# Patient Record
Sex: Female | Born: 1995 | Race: White | Hispanic: No | Marital: Single | Smoking: Current every day smoker
Health system: Southern US, Community
[De-identification: ages and names within clinical notes are randomized; demographics above are authoritative.]

## PROBLEM LIST (undated history)

## (undated) DIAGNOSIS — F329 Major depressive disorder, single episode, unspecified: Secondary | ICD-10-CM

## (undated) DIAGNOSIS — R55 Syncope and collapse: Secondary | ICD-10-CM

## (undated) DIAGNOSIS — J45909 Unspecified asthma, uncomplicated: Secondary | ICD-10-CM

## (undated) DIAGNOSIS — F32A Depression, unspecified: Secondary | ICD-10-CM

## (undated) DIAGNOSIS — F419 Anxiety disorder, unspecified: Secondary | ICD-10-CM

## (undated) HISTORY — DX: Unspecified asthma, uncomplicated: J45.909

## (undated) HISTORY — DX: Depression, unspecified: F32.A

## (undated) HISTORY — DX: Anxiety disorder, unspecified: F41.9

## (undated) HISTORY — DX: Major depressive disorder, single episode, unspecified: F32.9

## (undated) HISTORY — DX: Syncope and collapse: R55

---
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
## 2017-04-16 IMAGING — CT CT HEAD W/O CM
1 series · 16 of 30 positions shown, 20 images · non-contrast
Comparison: None.

CLINICAL DATA: Syncope. Patient has passed a 12 times in the past
year.

EXAM:
CT HEAD WITHOUT CONTRAST
TECHNIQUE: Contiguous axial images were obtained from the base of the skull
through the vertex without intravenous contrast.

[Series 2: head wo · axial · 0.40mm/px · z∈[-52,+92]mm · 16 of 36 slices shown, 20 images]
[im 2/36  brain]
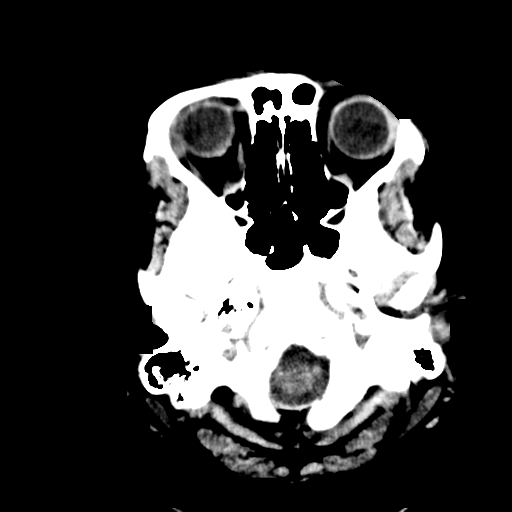
[im 2/36  bone]
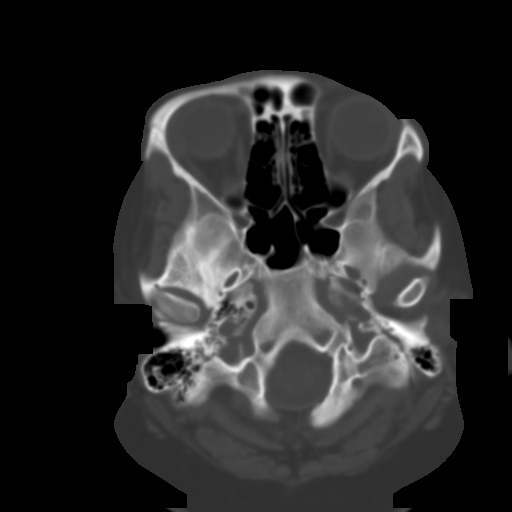
[im 4/36  brain]
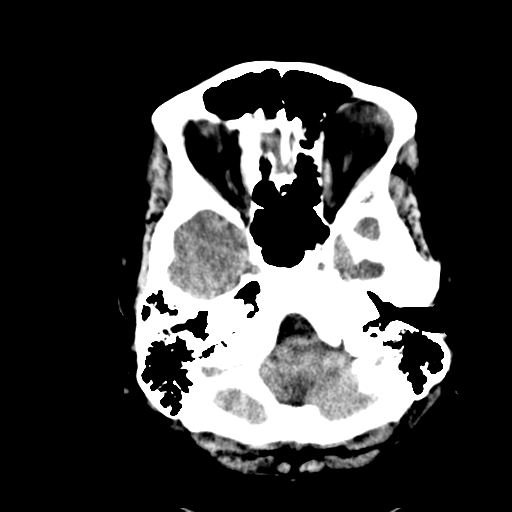
[im 7/36  brain]
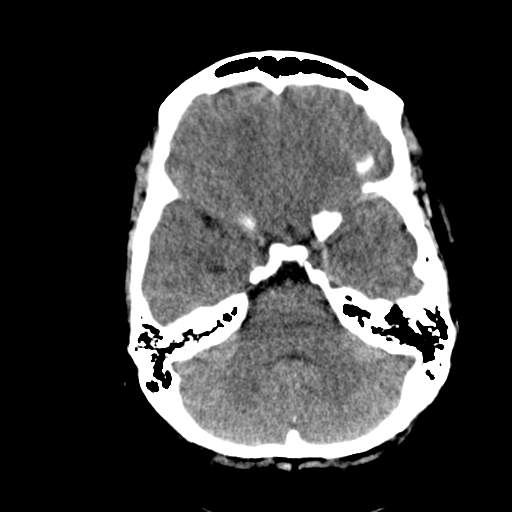
[im 9/36  brain]
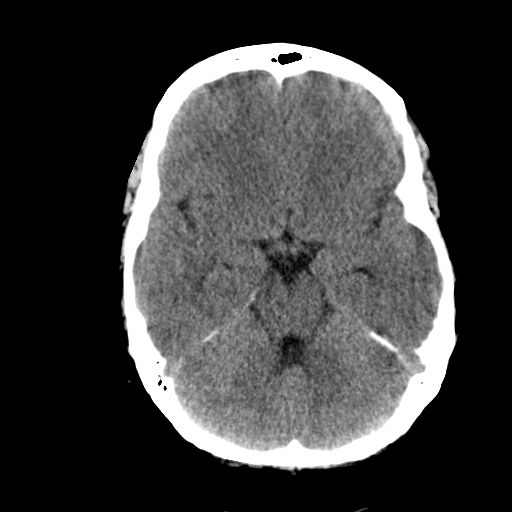
[im 10/36  brain]
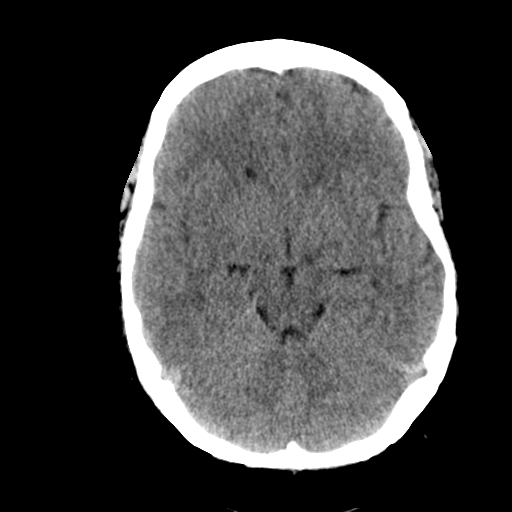
[im 10/36  bone]
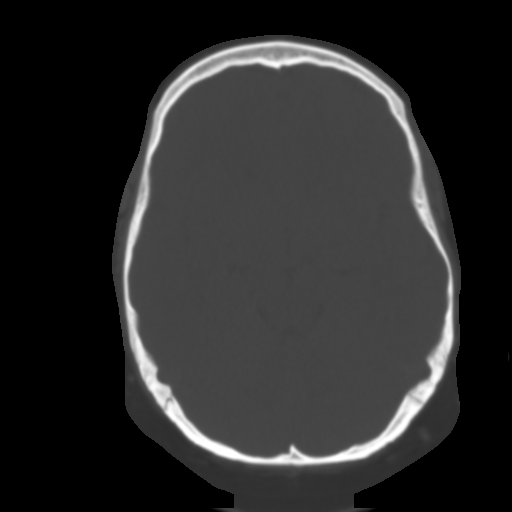
[im 13/36  brain]
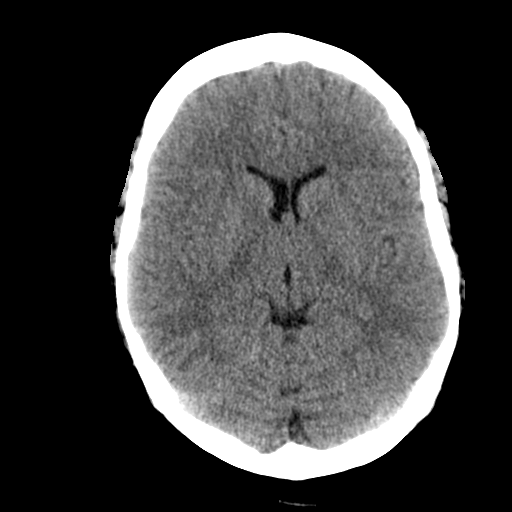
[im 15/36  brain]
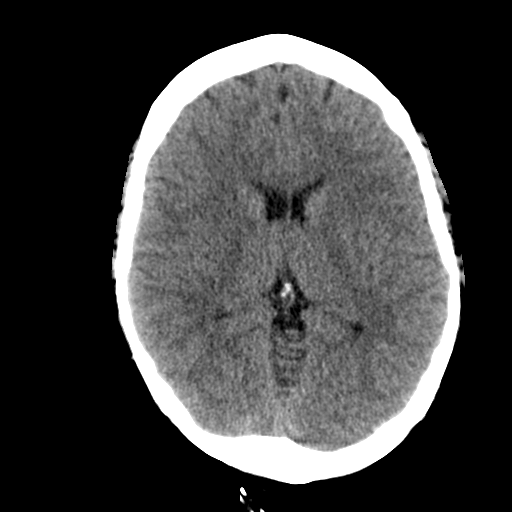
[im 17/36  brain]
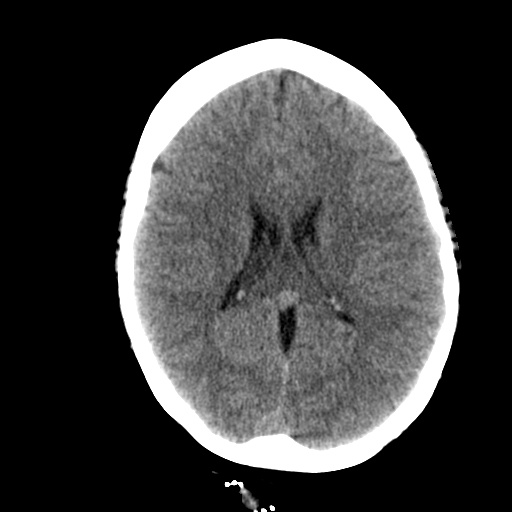
[im 19/36  brain]
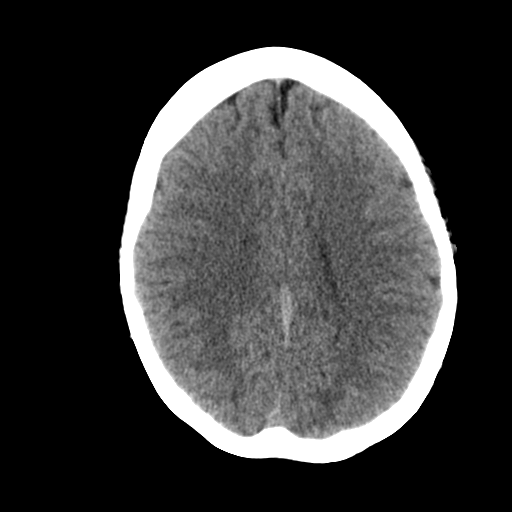
[im 19/36  bone]
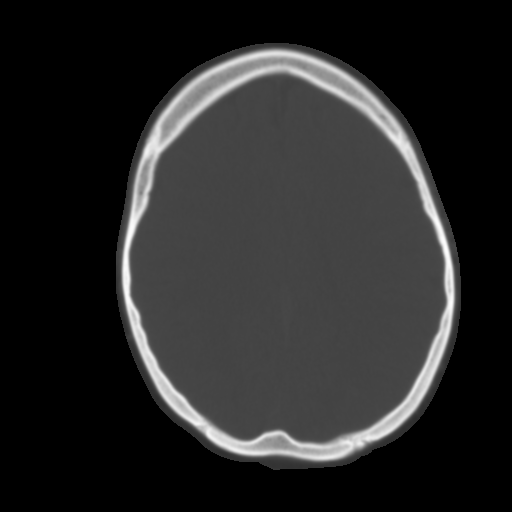
[im 21/36  brain]
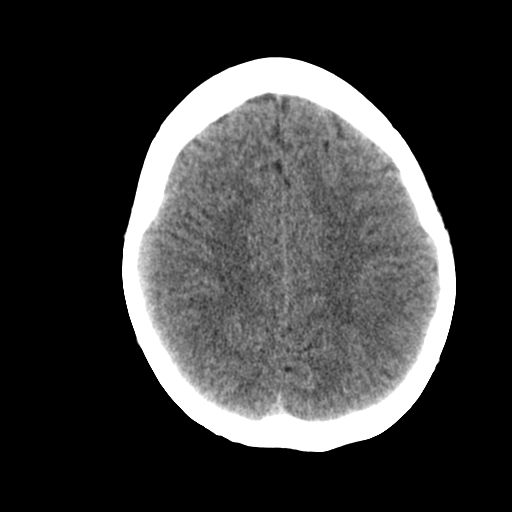
[im 23/36  brain]
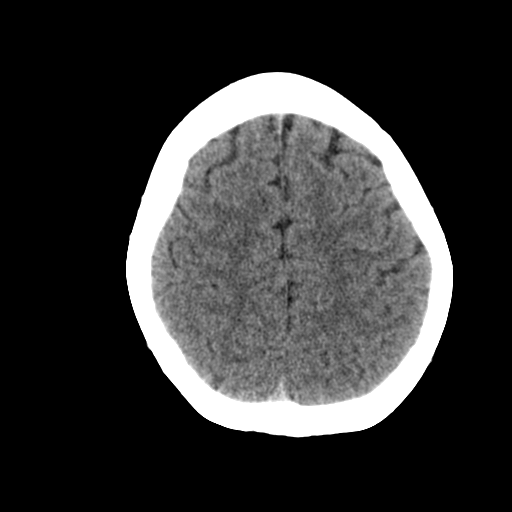
[im 26/36  brain]
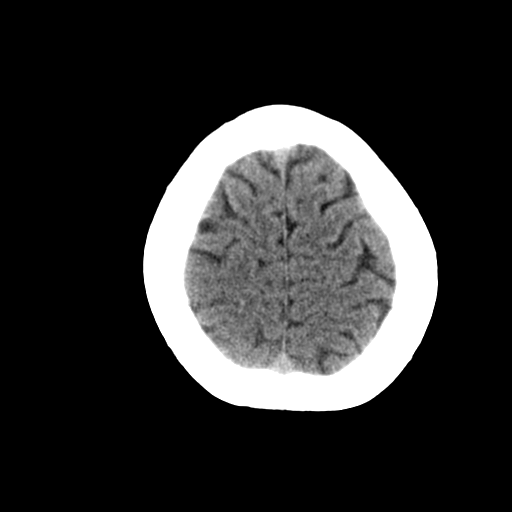
[im 27/36  brain]
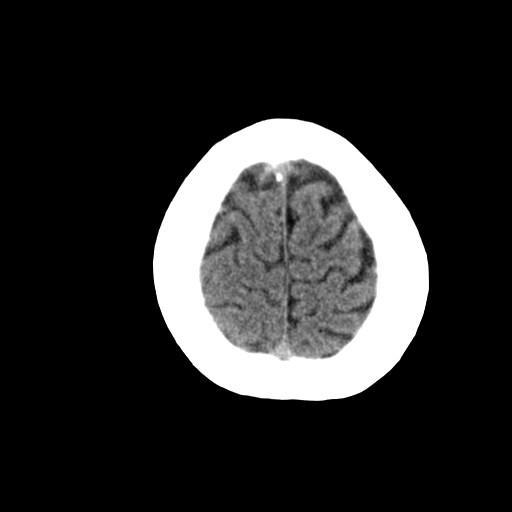
[im 27/36  bone]
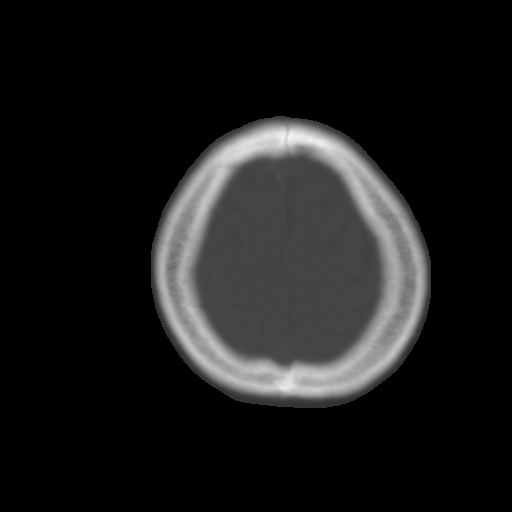
[im 29/36  brain]
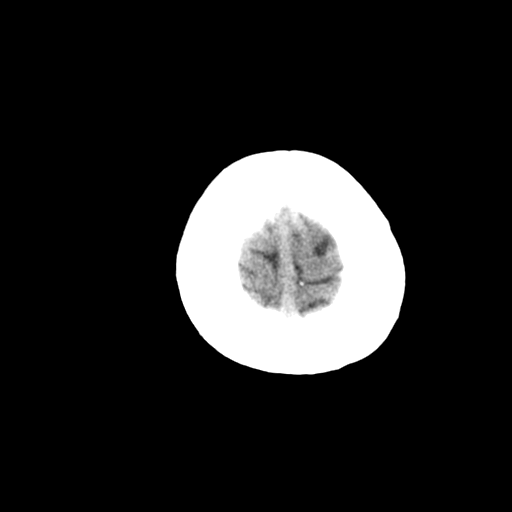
[im 32/36  brain]
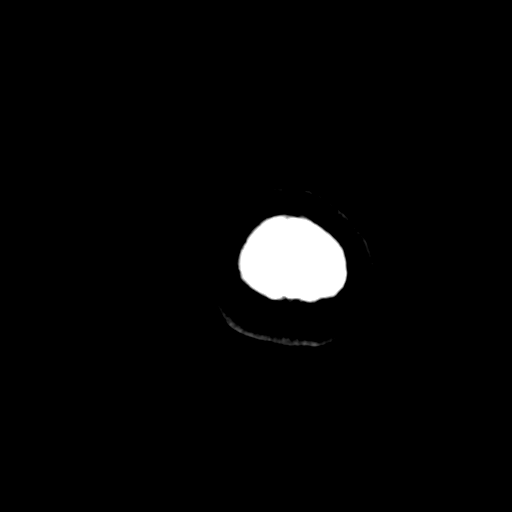
[im 34/36  brain]
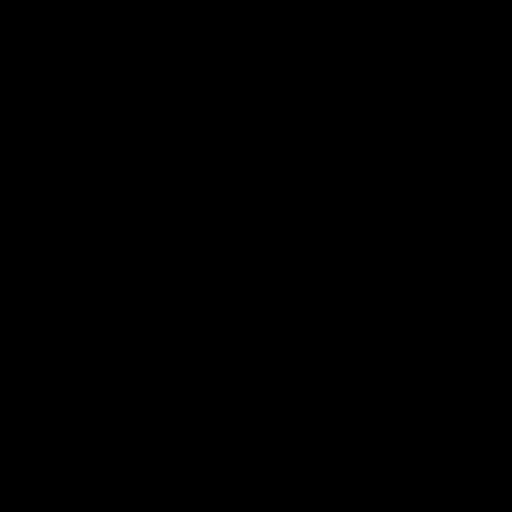

[16 of 30 positions shown; findings below may reference images not displayed]

FINDINGS: Skull and Sinuses:Negative for fracture or destructive process. The
mastoids, middle ears, and imaged paranasal sinuses are clear.

Orbits: No acute abnormality.

Brain: No acute or remote infarction, hemorrhage, hydrocephalus, or
mass lesion/mass effect.
IMPRESSION: Normal head CT.

## 2021-06-21 ENCOUNTER — Other Ambulatory Visit: Payer: Self-pay | Admitting: Family Medicine

## 2021-06-21 DIAGNOSIS — R109 Unspecified abdominal pain: Secondary | ICD-10-CM

## 2021-07-12 ENCOUNTER — Ambulatory Visit
Admission: RE | Admit: 2021-07-12 | Discharge: 2021-07-12 | Disposition: A | Discharge status: Home / Self Care | Payer: No Typology Code available for payment source | Source: Ambulatory Visit | Attending: Family Medicine | Admitting: Family Medicine

## 2021-07-12 ENCOUNTER — Other Ambulatory Visit: Payer: Self-pay

## 2021-07-12 DIAGNOSIS — R109 Unspecified abdominal pain: Secondary | ICD-10-CM | POA: Diagnosis not present

## 2021-07-12 MED ORDER — IOHEXOL 300 MG/ML  SOLN
100.0000 mL | Freq: Once | INTRAMUSCULAR | Status: AC | PRN
  Administered 2021-07-12: 100 mL via INTRAVENOUS

## 2023-03-24 IMAGING — CT CT ABD-PELV W/ CM
2 of 4 series · 16 of 46 positions shown, 18 images · IV contrast (omnipaque)
Comparison: None.

CLINICAL DATA: One month of intermittent right lower quadrant pain.

EXAM:
CT ABDOMEN AND PELVIS WITH CONTRAST
TECHNIQUE: Multidetector CT imaging of the abdomen and pelvis was performed
using the standard protocol following bolus administration of
intravenous contrast.
CONTRAST:  100mL OMNIPAQUE IOHEXOL 300 MG/ML  SOLN

[Series 2: abd pelvis 5.00 · axial · 0.98mm/px · z∈[-1482,-1007]mm · 13 of 105 slices shown, 15 images]
[im 5/105  soft-tissue]
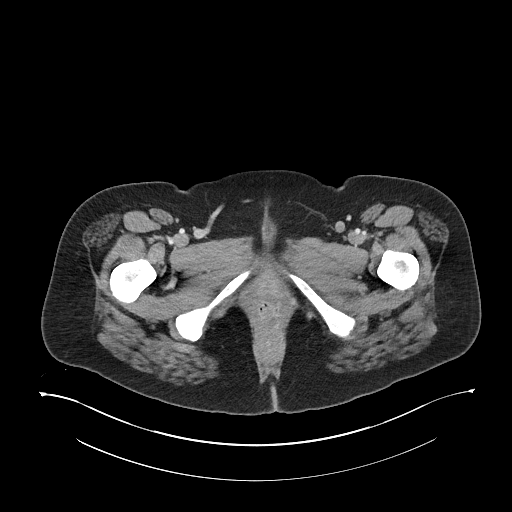
[im 5/105  bone]
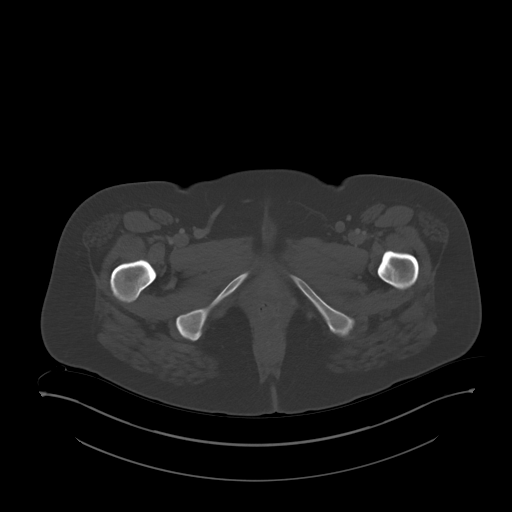
[im 14/105  soft-tissue]
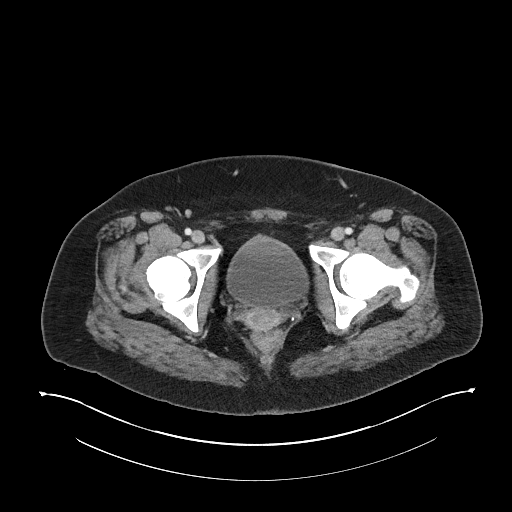
[im 22/105  soft-tissue]
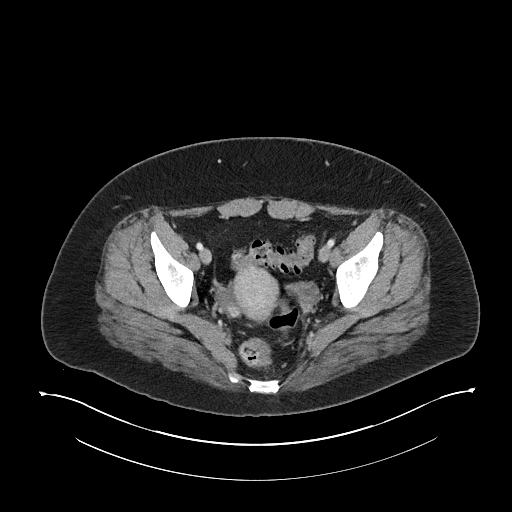
[im 31/105  soft-tissue]
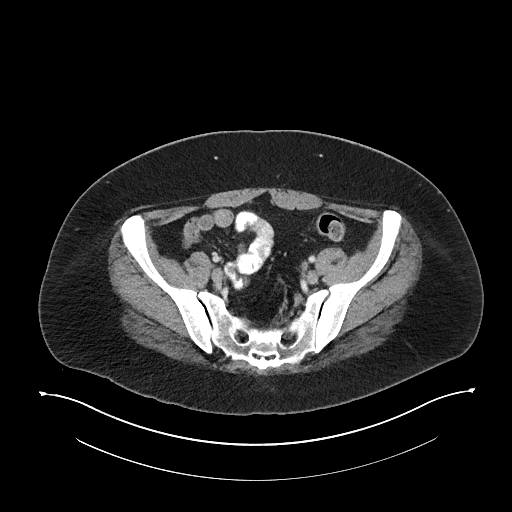
[im 35/105  soft-tissue]
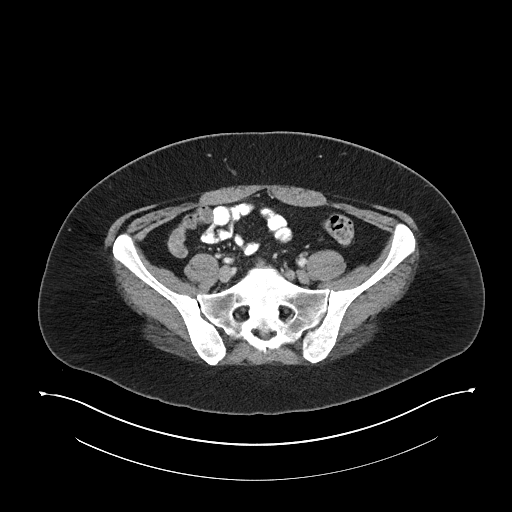
[im 44/105  soft-tissue]
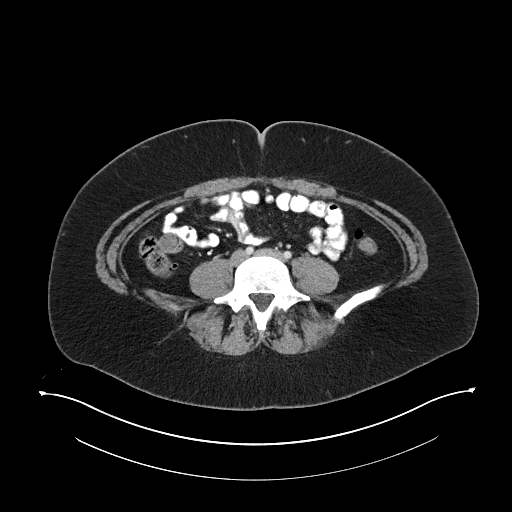
[im 53/105  soft-tissue]
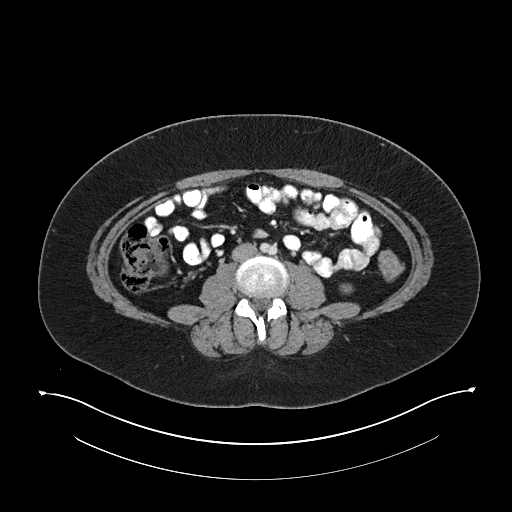
[im 61/105  soft-tissue]
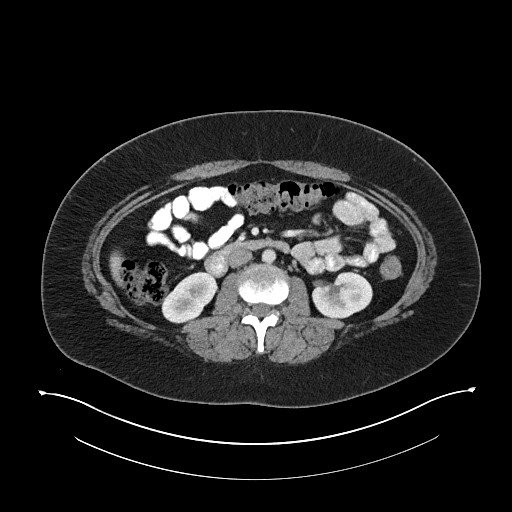
[im 70/105  soft-tissue]
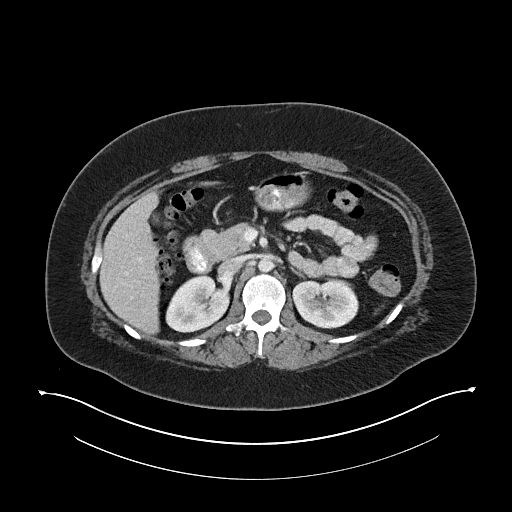
[im 70/105  bone]
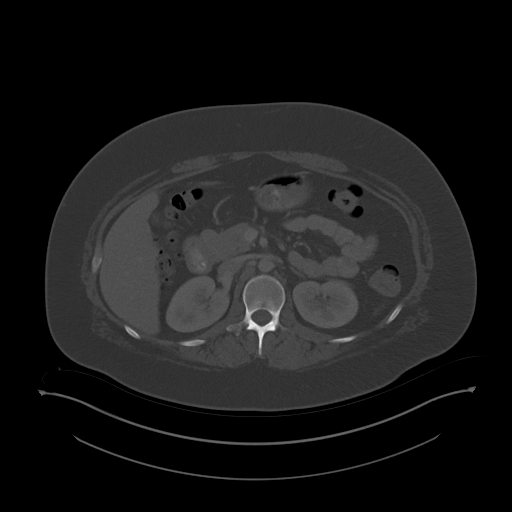
[im 74/105  soft-tissue]
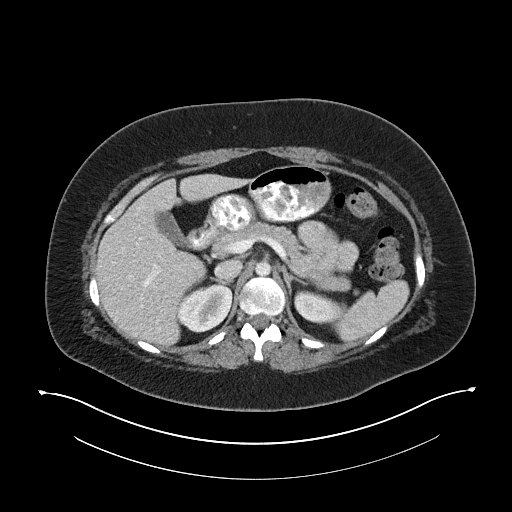
[im 83/105  soft-tissue]
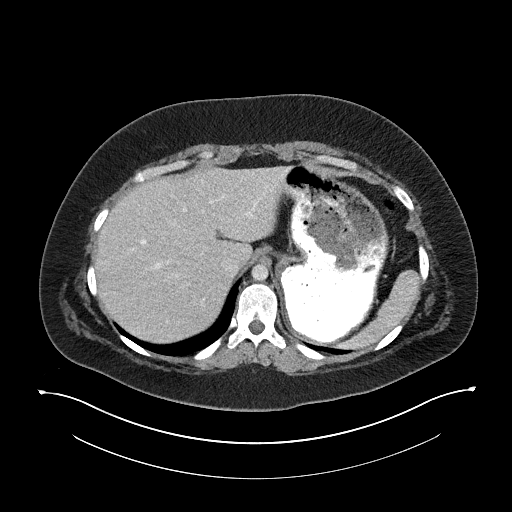
[im 92/105  soft-tissue]
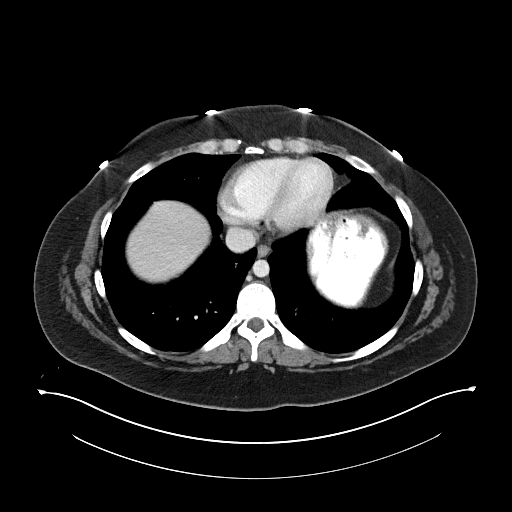
[im 100/105  soft-tissue]
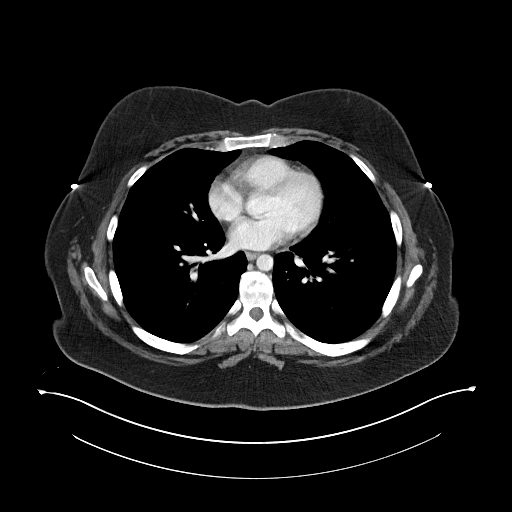

[Series 4: coronals abd pelvis 2.00 cor · coronal · 0.87mm/px · 3 of 169 slices shown]
[im 57/169  soft-tissue]
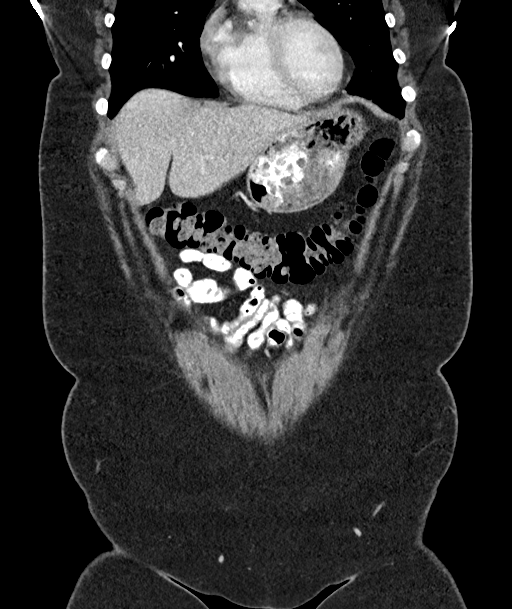
[im 75/169  soft-tissue]
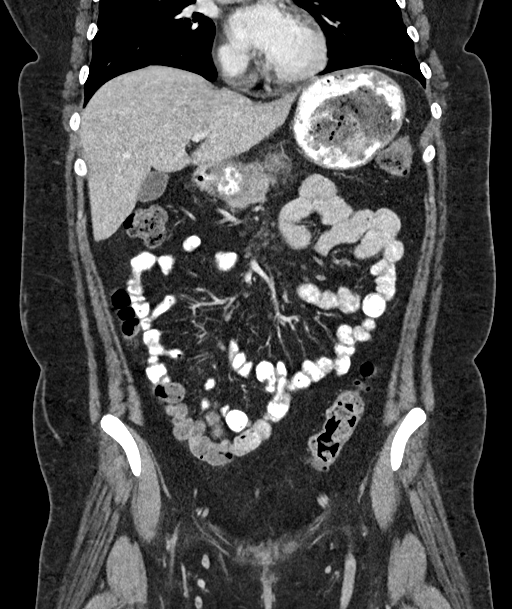
[im 94/169  soft-tissue]
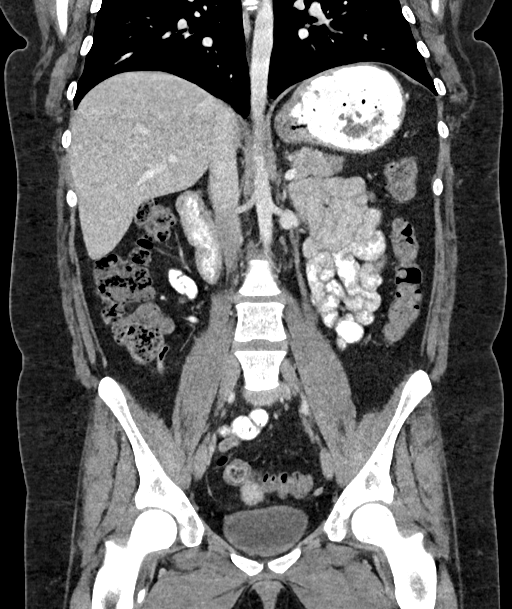

[16 of 46 positions shown; findings below may reference images not displayed]

FINDINGS: Lower chest: Mosaic attenuation in the lung bases.

Hepatobiliary: No suspicious hepatic lesion. Gallbladder is
unremarkable. No biliary ductal dilation.

Pancreas: No pancreatic ductal dilation or evidence of acute
inflammation.

Spleen: Within normal limits.

Adrenals/Urinary Tract: Adrenal glands are unremarkable. Kidneys are
normal, without renal calculi, solid enhancing lesion, or
hydronephrosis. Bladder is unremarkable for degree of distension.

Stomach/Bowel: Radiopaque enteric contrast traverses distal loops of
small bowel. The stomach is unremarkable. No pathologic dilation of
small or large bowel. The appendix and terminal ileum appear normal.
No evidence of acute bowel inflammation.

Vascular/Lymphatic: No abdominal aortic aneurysm. No pathologically
enlarged abdominal or pelvic lymph nodes.

Reproductive: Uterus and left adnexa are unremarkable. Physiologic
corpus luteal cyst in the right ovary, which is benign and requires
no further evaluation or follow-up.

Other: No abdominal wall hernia or abnormality. No abdominopelvic
ascites.

Musculoskeletal: No acute or significant osseous findings.
IMPRESSION: 1. No acute findings in the abdomen or pelvis. Normal appendix and
terminal ileum.
2. Mosaic attenuation in the lung bases, which can be seen with
small airways disease.
